# Patient Record
Sex: Male | Born: 1970 | Race: Black or African American | Hispanic: No | Marital: Married | Smoking: Current every day smoker
Health system: Southern US, Community
[De-identification: ages and names within clinical notes are randomized; demographics above are authoritative.]

---

## 2006-11-12 ENCOUNTER — Encounter: Admission: RE | Admit: 2006-11-12 | Discharge: 2006-11-12 | Payer: Self-pay | Admitting: General Practice

## 2012-09-30 ENCOUNTER — Emergency Department (HOSPITAL_COMMUNITY)
Admission: EM | Admit: 2012-09-30 | Discharge: 2012-09-30 | Disposition: A | Discharge status: Home / Self Care | Payer: No Typology Code available for payment source | Attending: Emergency Medicine | Admitting: Emergency Medicine

## 2012-09-30 ENCOUNTER — Emergency Department (HOSPITAL_COMMUNITY): Payer: No Typology Code available for payment source

## 2012-09-30 ENCOUNTER — Encounter (HOSPITAL_COMMUNITY): Payer: Self-pay

## 2012-09-30 DIAGNOSIS — R51 Headache: Secondary | ICD-10-CM | POA: Insufficient documentation

## 2012-09-30 DIAGNOSIS — M542 Cervicalgia: Secondary | ICD-10-CM | POA: Insufficient documentation

## 2012-09-30 DIAGNOSIS — M79609 Pain in unspecified limb: Secondary | ICD-10-CM | POA: Insufficient documentation

## 2012-09-30 DIAGNOSIS — Y9389 Activity, other specified: Secondary | ICD-10-CM | POA: Insufficient documentation

## 2012-09-30 DIAGNOSIS — R079 Chest pain, unspecified: Secondary | ICD-10-CM | POA: Insufficient documentation

## 2012-09-30 DIAGNOSIS — Y929 Unspecified place or not applicable: Secondary | ICD-10-CM | POA: Insufficient documentation

## 2012-09-30 DIAGNOSIS — S0990XA Unspecified injury of head, initial encounter: Secondary | ICD-10-CM | POA: Insufficient documentation

## 2012-09-30 MED ORDER — HYDROCODONE-ACETAMINOPHEN 5-325 MG PO TABS: 2.0000 | ORAL_TABLET | Freq: Once | ORAL | Status: DC

## 2012-09-30 MED ORDER — METHOCARBAMOL 500 MG PO TABS: 500.0000 mg | ORAL_TABLET | Freq: Two times a day (BID) | ORAL | Status: DC

## 2012-09-30 MED ORDER — IOHEXOL 300 MG/ML  SOLN
80.0000 mL | Freq: Once | INTRAMUSCULAR | Status: AC | PRN
  Administered 2012-09-30: 80 mL via INTRAVENOUS
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
## 2012-09-30 NOTE — ED Notes (Signed)
Assumed care of patient at this time
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
## 2012-10-01 NOTE — ED Provider Notes (Signed)
Medical screening examination/treatment/procedure(s) were performed by non-physician practitioner and as supervising physician I was immediately available for consultation/collaboration.  Jones Skene, M.D.     Jones Skene, MD 10/01/12 [id number]

## 2013-02-15 ENCOUNTER — Encounter (HOSPITAL_COMMUNITY): Payer: Self-pay | Admitting: *Deleted

## 2013-02-15 ENCOUNTER — Emergency Department (HOSPITAL_COMMUNITY)
Admission: EM | Admit: 2013-02-15 | Discharge: 2013-02-15 | Disposition: A | Discharge status: Home / Self Care | Payer: Medicaid Other | Attending: Emergency Medicine | Admitting: Emergency Medicine

## 2013-02-15 DIAGNOSIS — Y990 Civilian activity done for income or pay: Secondary | ICD-10-CM | POA: Insufficient documentation

## 2013-02-15 DIAGNOSIS — IMO0002 Reserved for concepts with insufficient information to code with codable children: Secondary | ICD-10-CM | POA: Insufficient documentation

## 2013-02-15 DIAGNOSIS — X503XXA Overexertion from repetitive movements, initial encounter: Secondary | ICD-10-CM | POA: Insufficient documentation

## 2013-02-15 DIAGNOSIS — Y9289 Other specified places as the place of occurrence of the external cause: Secondary | ICD-10-CM | POA: Insufficient documentation

## 2013-02-15 DIAGNOSIS — M5410 Radiculopathy, site unspecified: Secondary | ICD-10-CM

## 2013-02-15 DIAGNOSIS — F17200 Nicotine dependence, unspecified, uncomplicated: Secondary | ICD-10-CM | POA: Insufficient documentation

## 2013-02-15 DIAGNOSIS — T148XXA Other injury of unspecified body region, initial encounter: Secondary | ICD-10-CM

## 2013-02-15 DIAGNOSIS — Y9389 Activity, other specified: Secondary | ICD-10-CM | POA: Insufficient documentation

## 2013-02-15 MED ORDER — IBUPROFEN 400 MG PO TABS
800.0000 mg | ORAL_TABLET | Freq: Once | ORAL | Status: AC
  Administered 2013-02-15: 800 mg via ORAL
  Filled 2013-02-15: qty 2

## 2013-02-15 MED ORDER — IBUPROFEN 800 MG PO TABS: 800.0000 mg | ORAL_TABLET | Freq: Three times a day (TID) | ORAL | Status: DC

## 2013-02-15 MED ORDER — METHOCARBAMOL 500 MG PO TABS: 500.0000 mg | ORAL_TABLET | Freq: Two times a day (BID) | ORAL | Status: DC | PRN

## 2013-02-15 NOTE — ED Notes (Signed)
Pt reports lower back pain x 2 days, works in [employer] and does heavy lifting, denies any specific injury. Pain radiates down his legs, pt ambulatory at triage.

## 2013-02-15 NOTE — ED Provider Notes (Signed)
History     CSN: [id number]  Arrival date & time 02/15/13  1024   First MD Initiated Contact with Patient 02/15/13 1043      Chief Complaint  Patient presents with  . Back Pain    (Consider location/radiation/quality/duration/timing/severity/associated sxs/prior treatment) HPI Comments: This is a 42 year old man who woke up this morning in significant pain. He lifts heavy objects at work, but denies any sort of trauma or injury that he is aware of. The pain starts in his left lower back and goes into his posterior thigh. It is a shooting pain. He has not tried anything to relieve the pain. No weakness, numbness, bowel or bladder incontinence. No other associated symptoms.   Patient is a 42 y.o. male presenting with back pain. The history is provided by the patient. No language interpreter was used.  Back Pain Location:  Lumbar spine Quality:  Shooting Radiates to:  L posterior upper leg Pain severity now: 8/10. Onset quality:  Sudden Duration:  2 hours Timing:  Constant Chronicity:  New Context: lifting heavy objects   Context: not falling   Relieved by:  None tried Associated symptoms: no abdominal pain, no bladder incontinence, no bowel incontinence, no fever, no perianal numbness, no tingling and no weakness   Risk factors: no hx of cancer     History reviewed. No pertinent past medical history.  History reviewed. No pertinent past surgical history.  History reviewed. No pertinent family history.  History  Substance Use Topics  . Smoking status: Current Every Day Smoker    Types: Cigars  . Smokeless tobacco: Not on file  . Alcohol Use: No      Review of Systems  Constitutional: Negative for fever.  Gastrointestinal: Negative for abdominal pain and bowel incontinence.  Genitourinary: Negative for bladder incontinence.  Musculoskeletal: Positive for back pain.  Neurological: Negative for tingling and weakness.  All other systems reviewed and are  negative.    Allergies  Review of patient's allergies indicates no known allergies.  Home Medications  No current outpatient prescriptions on file.  BP 152/94  Pulse 80  Temp(Src) 98 F (36.7 C) (Oral)  Resp 20  SpO2 98%  Physical Exam  Nursing note and vitals reviewed. Constitutional: He is oriented to person, place, and time. He appears well-developed and well-nourished. He is cooperative. No distress.  HENT:  Head: Normocephalic and atraumatic.  Right Ear: External ear normal.  Left Ear: External ear normal.  Nose: Nose normal.  Mouth/Throat: Oropharynx is clear and moist.  Eyes: Conjunctivae are normal.  Neck: Normal range of motion. No tracheal deviation present.  Cardiovascular: Normal rate and regular rhythm.  Exam reveals no gallop and no friction rub.   No murmur heard. Pulmonary/Chest: Effort normal and breath sounds normal. No stridor.  Abdominal: Soft. He exhibits no distension. There is no tenderness.  Musculoskeletal: Normal range of motion. He exhibits tenderness. He exhibits no edema.       Lumbar back: He exhibits tenderness and spasm. He exhibits no bony tenderness, no swelling and no deformity.  Pain with palpation in left lumbar paraspinal muscles  Neurological: He is alert and oriented to person, place, and time. He has normal reflexes. He displays normal reflexes. He exhibits normal muscle tone. Coordination normal.  Skin: Skin is warm and dry. He is not diaphoretic.  Psychiatric: He has a normal mood and affect. His behavior is normal.    ED Course  Procedures (including critical care time)  Labs Reviewed -  No data to display No results found.   1. Muscle strain   2. Radicular pain of left lower extremity       MDM  Patient presents today with sudden onset left lower back pain radiating to his left leg. No concern for cauda equina. No concern for fracture. Patient denies narcotic pain medication. Given Robaxin for spasm and ibuprofen for  pain. Ice and rest. Follow up with ortho if no improvement. Return precautions given.        Mora Bellman, PA-C 02/15/13 1940

## 2013-02-16 NOTE — ED Provider Notes (Signed)
Medical screening examination/treatment/procedure(s) were performed by non-physician practitioner and as supervising physician I was immediately available for consultation/collaboration.   Shelda Jakes, MD 02/16/13 1210

## 2013-03-31 ENCOUNTER — Emergency Department (HOSPITAL_BASED_OUTPATIENT_CLINIC_OR_DEPARTMENT_OTHER)
Admission: EM | Admit: 2013-03-31 | Discharge: 2013-03-31 | Discharge status: Against Medical Advice | Payer: Medicaid Other | Attending: Emergency Medicine | Admitting: Emergency Medicine

## 2013-03-31 ENCOUNTER — Encounter (HOSPITAL_BASED_OUTPATIENT_CLINIC_OR_DEPARTMENT_OTHER): Payer: Self-pay | Admitting: *Deleted

## 2013-03-31 DIAGNOSIS — F17200 Nicotine dependence, unspecified, uncomplicated: Secondary | ICD-10-CM | POA: Insufficient documentation

## 2013-03-31 DIAGNOSIS — R030 Elevated blood-pressure reading, without diagnosis of hypertension: Secondary | ICD-10-CM | POA: Insufficient documentation

## 2013-03-31 DIAGNOSIS — I10 Essential (primary) hypertension: Secondary | ICD-10-CM | POA: Insufficient documentation

## 2013-03-31 NOTE — ED Notes (Signed)
States he wants to have his blood pressure checked.

## 2013-03-31 NOTE — ED Notes (Signed)
Patient stopped by the nursing station stating that he was leaving because no one is checking on him.  Patient walked out of the department.
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
## 2013-08-08 ENCOUNTER — Encounter (HOSPITAL_COMMUNITY): Payer: Self-pay | Admitting: Emergency Medicine

## 2013-08-08 ENCOUNTER — Emergency Department (HOSPITAL_COMMUNITY)
Admission: EM | Admit: 2013-08-08 | Discharge: 2013-08-08 | Disposition: A | Discharge status: Home / Self Care | Payer: Medicaid Other | Attending: Emergency Medicine | Admitting: Emergency Medicine

## 2013-08-08 DIAGNOSIS — F17200 Nicotine dependence, unspecified, uncomplicated: Secondary | ICD-10-CM | POA: Insufficient documentation

## 2013-08-08 DIAGNOSIS — M549 Dorsalgia, unspecified: Secondary | ICD-10-CM

## 2013-08-08 DIAGNOSIS — M545 Low back pain, unspecified: Secondary | ICD-10-CM | POA: Insufficient documentation

## 2013-08-08 MED ORDER — IBUPROFEN 800 MG PO TABS: 800.0000 mg | ORAL_TABLET | Freq: Three times a day (TID) | ORAL | Status: AC

## 2013-08-08 MED ORDER — METHOCARBAMOL 500 MG PO TABS: 500.0000 mg | ORAL_TABLET | Freq: Two times a day (BID) | ORAL | Status: AC

## 2013-08-08 NOTE — ED Provider Notes (Signed)
CSN: [id number]     Arrival date & time 08/08/13  0830 History   First MD Initiated Contact with Patient 08/08/13 0840     Chief Complaint  Patient presents with  . Back Pain   (Consider location/radiation/quality/duration/timing/severity/associated sxs/prior Treatment) HPI Comments: Patient presents to the ED with low back pain. He has a constant throbbing pain across his low back that radiates to his hips. He denies any radiation down his legs. He denies any numbness or weakness to his legs. He denies abdominal pain. He denies any urinary problems or bowel or bladder incontinence. He denies any fevers or chills. He denies any known injuries to his back. He has not taken anything at home for the pain. He has had a history of similar complaints in the past. He states the pain is worse when he stands up straight and better when he bends over slightly.  Patient is a 42 y.o. male presenting with back pain.  Back Pain Associated symptoms: no abdominal pain, no chest pain, no fever, no headaches, no numbness and no weakness     History reviewed. No pertinent past medical history. History reviewed. No pertinent past surgical history. History reviewed. No pertinent family history. History  Substance Use Topics  . Smoking status: Current Every Day Smoker    Types: Cigars  . Smokeless tobacco: Not on file  . Alcohol Use: No    Review of Systems  Constitutional: Negative for fever, chills, diaphoresis and fatigue.  HENT: Negative for congestion, rhinorrhea and sneezing.   Eyes: Negative.   Respiratory: Negative for cough, chest tightness and shortness of breath.   Cardiovascular: Negative for chest pain and leg swelling.  Gastrointestinal: Negative for nausea, vomiting, abdominal pain, diarrhea and blood in stool.  Genitourinary: Negative for frequency, hematuria, flank pain and difficulty urinating.  Musculoskeletal: Positive for back pain. Negative for arthralgias.  Skin: Negative for  rash.  Neurological: Negative for dizziness, speech difficulty, weakness, numbness and headaches.    Allergies  Review of patient's allergies indicates no known allergies.  Home Medications   Current Outpatient Rx  Name  Route  Sig  Dispense  Refill  . ibuprofen (ADVIL,MOTRIN) 800 MG tablet   Oral   Take 1 tablet (800 mg total) by mouth 3 (three) times daily.   21 tablet   0   . methocarbamol (ROBAXIN) 500 MG tablet   Oral   Take 1 tablet (500 mg total) by mouth 2 (two) times daily.   20 tablet   0    BP 154/103  Pulse 64  Temp(Src) 97.4 F (36.3 C) (Oral)  Ht 5\' 11"  (1.803 m)  Wt 200 lb (90.719 kg)  BMI 27.91 kg/m2  SpO2 99% Physical Exam  Constitutional: He is oriented to person, place, and time. He appears well-developed and well-nourished.  HENT:  Head: Normocephalic and atraumatic.  Eyes: Pupils are equal, round, and reactive to light.  Neck: Normal range of motion. Neck supple.  Cardiovascular: Normal rate, regular rhythm and normal heart sounds.   Pulmonary/Chest: Effort normal and breath sounds normal. No respiratory distress. He has no wheezes. He has no rales. He exhibits no tenderness.  Abdominal: Soft. Bowel sounds are normal. There is no tenderness. There is no rebound and no guarding.  Musculoskeletal: Normal range of motion. He exhibits no edema.  Mild tenderness across the lumbar musculature bilaterally. No pain along the spine. Pedal pulses are intact and symmetric.  Lymphadenopathy:    He has no cervical adenopathy.  Neurological:  He is alert and oriented to person, place, and time. He has normal strength. No sensory deficit. GCS eye subscore is 4. GCS verbal subscore is 5. GCS motor subscore is 6.  Patellar reflexes symmetric. Negative straight leg raise bilaterally  Skin: Skin is warm and dry. No rash noted.  Psychiatric: He has a normal mood and affect.    ED Course  Procedures (including critical care time) Labs Review Labs Reviewed - No  data to display Imaging Review No results found.  MDM   1. Back pain    Patient likely musculoskeletal back pain. He denies any recent injuries. He has no neurologic deficits. There is no concerning symptoms for AAA. He was discharged home in good condition. He was given a prescribe her ibuprofen and Robaxin. His blood pressure was elevated and he was encouraged to followup with primary care physician regarding this to have a recheck.    Rolan Bucco, MD 08/08/13 [phone]

## 2013-08-08 NOTE — ED Notes (Signed)
Patient presents to ED with complaints of back pain and both hips worse in lumbar area and right hip that started 3 days ago.

## 2013-08-08 NOTE — ED Notes (Signed)
Pt discharged home. Instructed to re-check BP. Has no further questions at the time.

## 2017-11-27 ENCOUNTER — Other Ambulatory Visit: Payer: Self-pay

## 2017-11-27 ENCOUNTER — Emergency Department (HOSPITAL_COMMUNITY): Payer: Self-pay

## 2017-11-27 ENCOUNTER — Emergency Department (HOSPITAL_COMMUNITY)
Admission: EM | Admit: 2017-11-27 | Discharge: 2017-11-27 | Disposition: A | Discharge status: Home / Self Care | Payer: Self-pay | Attending: Emergency Medicine | Admitting: Emergency Medicine

## 2017-11-27 ENCOUNTER — Encounter (HOSPITAL_COMMUNITY): Payer: Self-pay | Admitting: Emergency Medicine

## 2017-11-27 DIAGNOSIS — Z79899 Other long term (current) drug therapy: Secondary | ICD-10-CM | POA: Insufficient documentation

## 2017-11-27 DIAGNOSIS — Y929 Unspecified place or not applicable: Secondary | ICD-10-CM | POA: Insufficient documentation

## 2017-11-27 DIAGNOSIS — Y939 Activity, unspecified: Secondary | ICD-10-CM | POA: Insufficient documentation

## 2017-11-27 DIAGNOSIS — Y999 Unspecified external cause status: Secondary | ICD-10-CM | POA: Insufficient documentation

## 2017-11-27 DIAGNOSIS — M5442 Lumbago with sciatica, left side: Secondary | ICD-10-CM | POA: Insufficient documentation

## 2017-11-27 DIAGNOSIS — F17290 Nicotine dependence, other tobacco product, uncomplicated: Secondary | ICD-10-CM | POA: Insufficient documentation

## 2017-11-27 MED ORDER — IBUPROFEN 400 MG PO TABS
600.0000 mg | ORAL_TABLET | Freq: Once | ORAL | Status: AC
  Administered 2017-11-27: 600 mg via ORAL
  Filled 2017-11-27: qty 1

## 2017-11-27 MED ORDER — CYCLOBENZAPRINE HCL 10 MG PO TABS: 10.0000 mg | ORAL_TABLET | Freq: Two times a day (BID) | ORAL | 0 refills | Status: AC | PRN

## 2017-11-27 MED ORDER — PREDNISONE 20 MG PO TABS: 40.0000 mg | ORAL_TABLET | Freq: Every day | ORAL | 0 refills | Status: AC
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
## 2017-11-27 NOTE — ED Notes (Signed)
Patient transported to X-ray 

## 2017-11-27 NOTE — Discharge Instructions (Signed)
The x-ray of your lower back was reassuring.  No broken bones.  I have written you a prescription for 2 medicines.  One is a muscle relaxer called Flexeril.  This medicine can make you drowsy so please do not drive, work or drink alcohol while taking it.  I have also written you a prescription for prednisone, please take 40 mg daily for the next 5 days.  Schedule an appointment to follow-up with your primary care doctor next week if your symptoms continue.  Your blood pressure was also elevated in the ER today, please have this rechecked.  You can apply heat to the lower back to also help with your symptoms.  I have written you a work note.  Please return to the emergency department if you have numbness in which he can no longer feel the legs, loss of bowel or bladder control, can no longer walk due to weakness or have any new or worsening symptoms.
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
## 2018-10-29 DIAGNOSIS — E1149 Type 2 diabetes mellitus with other diabetic neurological complication: Secondary | ICD-10-CM | POA: Diagnosis not present

## 2018-10-29 DIAGNOSIS — M545 Low back pain: Secondary | ICD-10-CM | POA: Diagnosis not present

## 2018-10-29 DIAGNOSIS — Z125 Encounter for screening for malignant neoplasm of prostate: Secondary | ICD-10-CM | POA: Diagnosis not present

## 2018-10-29 DIAGNOSIS — I10 Essential (primary) hypertension: Secondary | ICD-10-CM | POA: Diagnosis not present

## 2018-11-11 DIAGNOSIS — M545 Low back pain: Secondary | ICD-10-CM | POA: Diagnosis not present

## 2018-11-11 DIAGNOSIS — I10 Essential (primary) hypertension: Secondary | ICD-10-CM | POA: Diagnosis not present

## 2018-11-11 DIAGNOSIS — E1149 Type 2 diabetes mellitus with other diabetic neurological complication: Secondary | ICD-10-CM | POA: Diagnosis not present

## 2020-04-28 DIAGNOSIS — E559 Vitamin D deficiency, unspecified: Secondary | ICD-10-CM | POA: Diagnosis not present

## 2020-04-28 DIAGNOSIS — E1149 Type 2 diabetes mellitus with other diabetic neurological complication: Secondary | ICD-10-CM | POA: Diagnosis not present

## 2020-04-28 DIAGNOSIS — Z13220 Encounter for screening for lipoid disorders: Secondary | ICD-10-CM | POA: Diagnosis not present

## 2020-04-28 DIAGNOSIS — Z0000 Encounter for general adult medical examination without abnormal findings: Secondary | ICD-10-CM | POA: Diagnosis not present

## 2020-04-28 DIAGNOSIS — N341 Nonspecific urethritis: Secondary | ICD-10-CM | POA: Diagnosis not present

## 2020-04-28 DIAGNOSIS — Z125 Encounter for screening for malignant neoplasm of prostate: Secondary | ICD-10-CM | POA: Diagnosis not present

## 2020-04-28 DIAGNOSIS — I10 Essential (primary) hypertension: Secondary | ICD-10-CM | POA: Diagnosis not present

## 2020-05-13 DIAGNOSIS — E1149 Type 2 diabetes mellitus with other diabetic neurological complication: Secondary | ICD-10-CM | POA: Diagnosis not present

## 2020-05-13 DIAGNOSIS — I10 Essential (primary) hypertension: Secondary | ICD-10-CM | POA: Diagnosis not present

## 2020-06-22 DIAGNOSIS — E1149 Type 2 diabetes mellitus with other diabetic neurological complication: Secondary | ICD-10-CM | POA: Diagnosis not present

## 2020-06-22 DIAGNOSIS — I10 Essential (primary) hypertension: Secondary | ICD-10-CM | POA: Diagnosis not present

## 2020-06-22 DIAGNOSIS — E0842 Diabetes mellitus due to underlying condition with diabetic polyneuropathy: Secondary | ICD-10-CM | POA: Diagnosis not present

## 2020-07-12 DIAGNOSIS — I10 Essential (primary) hypertension: Secondary | ICD-10-CM | POA: Diagnosis not present

## 2020-07-12 DIAGNOSIS — E1149 Type 2 diabetes mellitus with other diabetic neurological complication: Secondary | ICD-10-CM | POA: Diagnosis not present

## 2020-07-14 DIAGNOSIS — Z20828 Contact with and (suspected) exposure to other viral communicable diseases: Secondary | ICD-10-CM | POA: Diagnosis not present

## 2020-07-14 DIAGNOSIS — Z1152 Encounter for screening for COVID-19: Secondary | ICD-10-CM | POA: Diagnosis not present

## 2020-08-12 DIAGNOSIS — E1149 Type 2 diabetes mellitus with other diabetic neurological complication: Secondary | ICD-10-CM | POA: Diagnosis not present

## 2020-08-12 DIAGNOSIS — I10 Essential (primary) hypertension: Secondary | ICD-10-CM | POA: Diagnosis not present

## 2023-11-11 ENCOUNTER — Encounter (HOSPITAL_COMMUNITY): Payer: Self-pay

## 2023-11-11 ENCOUNTER — Emergency Department (HOSPITAL_COMMUNITY)
Admission: EM | Admit: 2023-11-11 | Discharge: 2023-11-12 | Disposition: A | Discharge status: Home / Self Care | Payer: BC Managed Care – PPO | Attending: Emergency Medicine | Admitting: Emergency Medicine

## 2023-11-11 ENCOUNTER — Other Ambulatory Visit: Payer: Self-pay

## 2023-11-11 DIAGNOSIS — H1131 Conjunctival hemorrhage, right eye: Secondary | ICD-10-CM | POA: Diagnosis not present

## 2023-11-11 DIAGNOSIS — H5789 Other specified disorders of eye and adnexa: Secondary | ICD-10-CM | POA: Diagnosis present

## 2023-11-11 NOTE — ED Provider Notes (Signed)
  [facility]-EMERGENCY DEPT [facility] Emergency Department Provider Note MRN:  [id number]  Arrival date & time: 11/11/23     Chief Complaint   Eye Problem   History of Present Illness   Joe Blair is a 52 y.o. year-old male presents to the ED with chief complaint of right eye redness. He states that he has had a cough and cold for the past few days.  He denies any trauma.  He denies any pain, irritation, or vision changes.  History provided by patient.   Review of Systems  Pertinent positive and negative review of systems noted in HPI.    Physical Exam   Vitals:   11/11/23 1743 11/11/23 2130  BP: (!) 169/106 (!) 161/100  Pulse: 63 64  Resp: 18 17  Temp: 98.2 F (36.8 C) 98.1 F (36.7 C)  SpO2: 100% 100%    CONSTITUTIONAL:  well-appearing, NAD NEURO:  Alert and oriented x 3, CN 3-12 grossly intact EYES:  eyes equal and reactive, R subconjunctival hematoma ENT/NECK:  Supple, no stridor CARDIO:  normal rate, regular rhythm, appears well-perfused  PULM:  No respiratory distress,  GI/GU:  non-distended,  MSK/SPINE:  No gross deformities, no edema, moves all extremities  SKIN:  no rash, atraumatic   *Additional and/or pertinent findings included in MDM below  Diagnostic and Interventional Summary    EKG Interpretation Date/Time:    Ventricular Rate:    PR Interval:    QRS Duration:    QT Interval:    QTC Calculation:   R Axis:      Text Interpretation:         Labs Reviewed - No data to display  No orders to display    Medications - No data to display   Procedures  /  Critical Care Procedures  ED Course and Medical Decision Making  I have reviewed the triage vital signs, the nursing notes, and pertinent available records from the EMR.  Social Determinants Affecting Complexity of Care: Patient has no clinically significant social determinants affecting this chief complaint..   ED Course:    Medical Decision Making Patient here with right eye  subconjunctival hematoma.  Has been having some cough and cold symptoms.  I suspect ruptured vessel from coughing or sneezing.  No concerning findings at this time.  Will discharge home.         Consultants: No consultations were needed in caring for this patient.   Treatment and Plan: Emergency department workup does not suggest an emergent condition requiring admission or immediate intervention beyond  what has been performed at this time. The patient is safe for discharge and has  been instructed to return immediately for worsening symptoms, change in  symptoms or any other concerns    Final Clinical Impressions(s) / ED Diagnoses     ICD-10-CM   1. Subconjunctival hemorrhage of right eye  H11.31       ED Discharge Orders     None         Discharge Instructions Discussed with and Provided to Patient:   Discharge Instructions   None      Roxy Horseman, PA-C 11/11/23 2348    Zadie Rhine, MD 11/12/23 [phone]

## 2023-11-11 NOTE — ED Triage Notes (Signed)
Pt c.o right eye redness x2 days, pt denies pain or vision changes
